# Patient Record
Sex: Female | Born: 1965 | Race: White | Hispanic: No | Marital: Married | Smoking: Never smoker
Health system: Southern US, Community
[De-identification: ages and names within clinical notes are randomized; demographics above are authoritative.]

## PROBLEM LIST (undated history)

## (undated) HISTORY — PX: COSMETIC SURGERY: SHX468

## (undated) HISTORY — PX: TUBAL LIGATION: SHX77

---

## 2013-10-27 ENCOUNTER — Encounter (HOSPITAL_BASED_OUTPATIENT_CLINIC_OR_DEPARTMENT_OTHER): Payer: Self-pay | Admitting: Emergency Medicine

## 2013-10-27 ENCOUNTER — Emergency Department (HOSPITAL_BASED_OUTPATIENT_CLINIC_OR_DEPARTMENT_OTHER)
Admission: EM | Admit: 2013-10-27 | Discharge: 2013-10-27 | Disposition: A | Discharge status: Home / Self Care | Payer: BC Managed Care – PPO | Attending: Emergency Medicine | Admitting: Emergency Medicine

## 2013-10-27 ENCOUNTER — Emergency Department (HOSPITAL_BASED_OUTPATIENT_CLINIC_OR_DEPARTMENT_OTHER): Payer: BC Managed Care – PPO

## 2013-10-27 DIAGNOSIS — S82109A Unspecified fracture of upper end of unspecified tibia, initial encounter for closed fracture: Secondary | ICD-10-CM | POA: Insufficient documentation

## 2013-10-27 DIAGNOSIS — S82111A Displaced fracture of right tibial spine, initial encounter for closed fracture: Secondary | ICD-10-CM

## 2013-10-27 DIAGNOSIS — W11XXXA Fall on and from ladder, initial encounter: Secondary | ICD-10-CM | POA: Insufficient documentation

## 2013-10-27 DIAGNOSIS — X500XXA Overexertion from strenuous movement or load, initial encounter: Secondary | ICD-10-CM | POA: Insufficient documentation

## 2013-10-27 DIAGNOSIS — Y929 Unspecified place or not applicable: Secondary | ICD-10-CM | POA: Insufficient documentation

## 2013-10-27 DIAGNOSIS — Y9339 Activity, other involving climbing, rappelling and jumping off: Secondary | ICD-10-CM | POA: Insufficient documentation

## 2013-10-27 MED ORDER — HYDROCODONE-ACETAMINOPHEN 5-325 MG PO TABS
1.0000 | ORAL_TABLET | Freq: Once | ORAL | Status: AC
  Administered 2013-10-27: 1 via ORAL
  Filled 2013-10-27: qty 1

## 2013-10-27 MED ORDER — HYDROCODONE-ACETAMINOPHEN 5-325 MG PO TABS: 2.0000 | ORAL_TABLET | Freq: Four times a day (QID) | ORAL | Status: DC | PRN

## 2013-10-27 NOTE — ED Notes (Signed)
Pt c/o right knee injury x 1 hr ago  

## 2013-10-27 NOTE — ED Provider Notes (Signed)
CSN: [id number]     Arrival date & time 10/27/13  1815 History   First MD Initiated Contact with Patient 10/27/13 1821     This chart was scribed for Gerhard Munch, MD by Arlan Organ, ED Scribe. This patient was seen in room MH08/MH08 and the patient's care was started 7:07 PM.   No chief complaint on file.  HPI HPI Comments: Sierra Silva is a 47 y.o. Female with a hx of knee pain who presents to the Emergency Department complaining of a knee pain that occurred 1 hour ago. She states she was climbing a ladder, fell down and twisted her knee. Pt denies any numbness in her toes. Pt states she is otherwise healthy. She denies currently being on any medications. Pt denies any hx of knee injury. She denies any allergies to medications.    No past medical history on file. No past surgical history on file. No family history on file. History  Substance Use Topics  . Smoking status: Not on file  . Smokeless tobacco: Not on file  . Alcohol Use: Not on file   OB History   No data available     Review of Systems  Constitutional:       Per HPI, otherwise negative  HENT:       Per HPI, otherwise negative  Respiratory:       Per HPI, otherwise negative  Cardiovascular:       Per HPI, otherwise negative  Gastrointestinal: Negative for vomiting.  Endocrine:       Negative aside from HPI  Genitourinary:       Neg aside from HPI   Musculoskeletal:       Per HPI, otherwise negative  Skin: Negative.   Neurological: Negative for syncope.    Allergies  Review of patient's allergies indicates not on file.  Home Medications  No current outpatient prescriptions on file. There were no vitals taken for this visit. Physical Exam  Nursing note and vitals reviewed. Constitutional: She is oriented to person, place, and time. She appears well-developed and well-nourished. No distress.  HENT:  Head: Normocephalic and atraumatic.  Eyes: Conjunctivae and EOM are normal.  Cardiovascular:  Normal rate and regular rhythm.   Pulmonary/Chest: Effort normal and breath sounds normal. No stridor. No respiratory distress.  Abdominal: She exhibits no distension.  Musculoskeletal: She exhibits tenderness. She exhibits no edema.       Right hip: Normal.       Right knee: She exhibits decreased range of motion, swelling and bony tenderness. She exhibits no effusion, no ecchymosis, no deformity, no laceration, no erythema, normal alignment, no LCL laxity, normal patellar mobility, normal meniscus and no MCL laxity. Tenderness found. Medial joint line and lateral joint line tenderness noted. No LCL and no patellar tendon tenderness noted.       Right ankle: Normal.  Neurological: She is alert and oriented to person, place, and time. No cranial nerve deficit.  Skin: Skin is warm and dry.  Psychiatric: She has a normal mood and affect.    ED Course  Procedures (including critical care time)  DIAGNOSTIC STUDIES: Oxygen Saturation is 99% on RA, Normal by my interpretation.    COORDINATION OF CARE: 7:11 PM- Will order X-Rays. Will give pain medication. Discussed treatment plan with pt at bedside and pt agreed to plan.      Update: X-rays suggest fracture, and with concern for tibial plateau involvement, CT scan was ordered. Labs Review Labs Reviewed - No data  to display Imaging Review No results found.  EKG Interpretation   None      Update: I discussed the patient's CT findings with orthopedist.  Patient will be discharged to follow up in the office after provision of a mobilization device. MDM  No diagnosis found.  I personally performed the services described in this documentation, which was scribed in my presence. The recorded information has been reviewed and is accurate.   This generally well female presents after a fall with ongoing pain in her knees.  On exam the knee is grossly stable, though there is tenderness to palpation about the lateral aspect.  The ankle and hip  are unremarkable.  She is distally neurovascularly intact.  After CT scan demonstrated a likely tibial spine fracture, the patient had a mobilization device applied, crutches provided, and was discharged in stable condition to follow up with orthopedics per  Gerhard Munch, MD 10/27/13 2024

## 2013-12-08 IMAGING — CT CT KNEE*R* W/O CM
3 series · 12 of 33 positions shown, 14 images · non-contrast
Comparison: Radiographs 10/27/2013.

CLINICAL DATA: Tibial plateau fracture.

EXAM:
CT OF THE RIGHT KNEE WITHOUT CONTRAST
TECHNIQUE: Multidetector CT imaging was performed according to the standard
protocol. Multiplanar CT image reconstructions were also generated.

[Series 4: knee 2.0 b31s · axial · 0.32mm/px · z∈[-206,-58]mm · 4 of 108 slices shown, 5 images]
[im 17/108  soft-tissue]
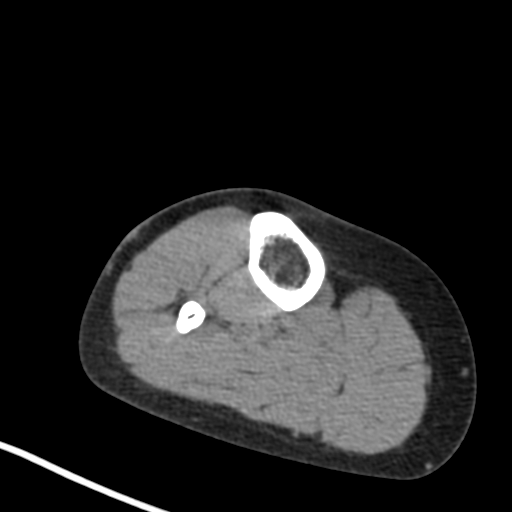
[im 17/108  bone]
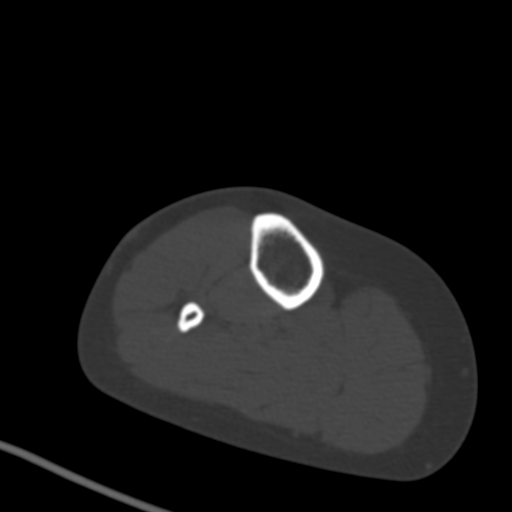
[im 42/108  bone]
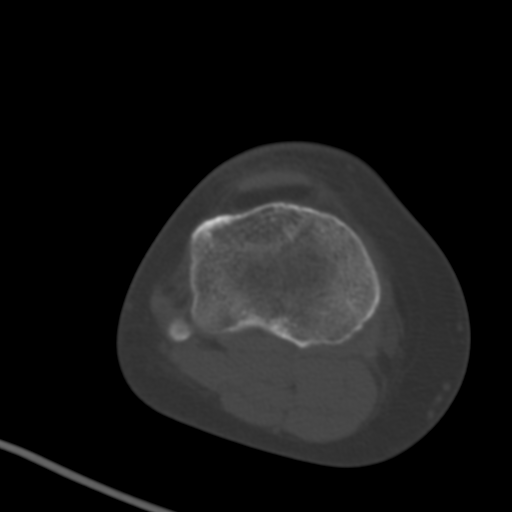
[im 66/108  bone]
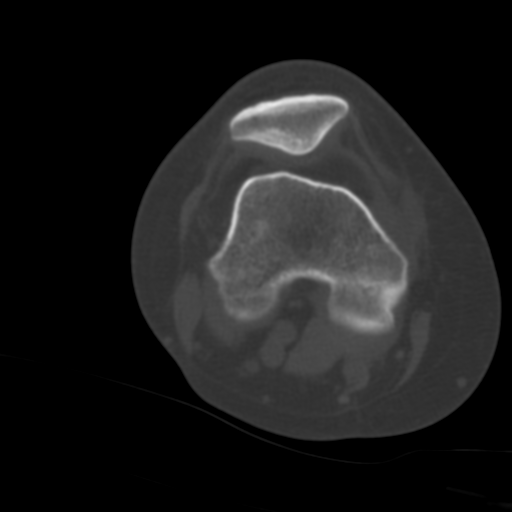
[im 91/108  bone]
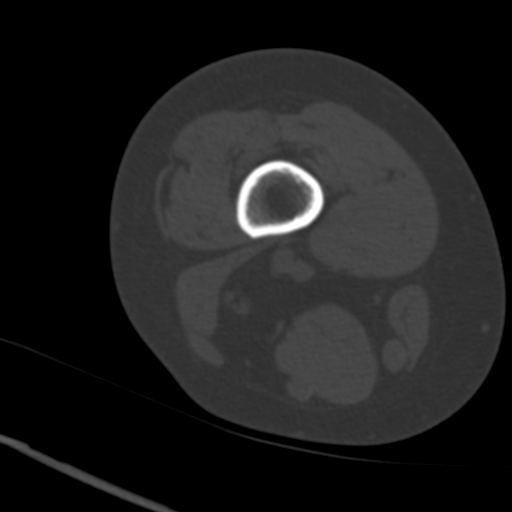

[Series 8: knee 2.0 coronal · coronal · 0.29mm/px · 3 of 66 slices shown]
[im 14/66  bone]
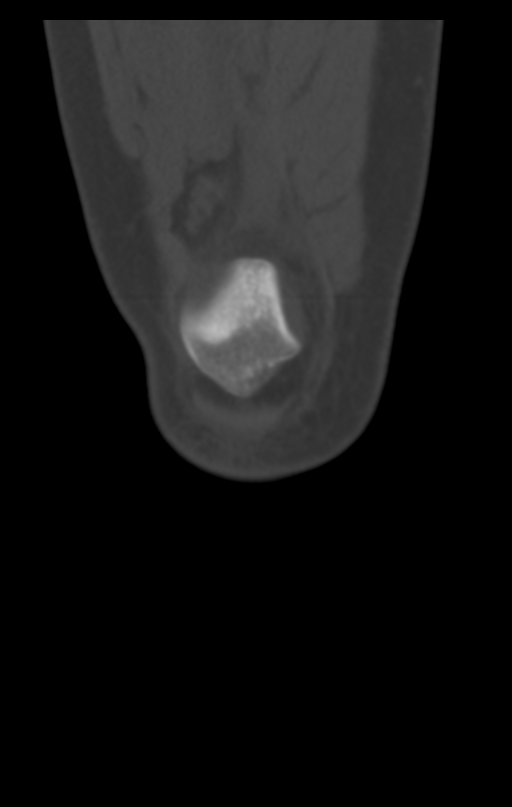
[im 27/66  bone]
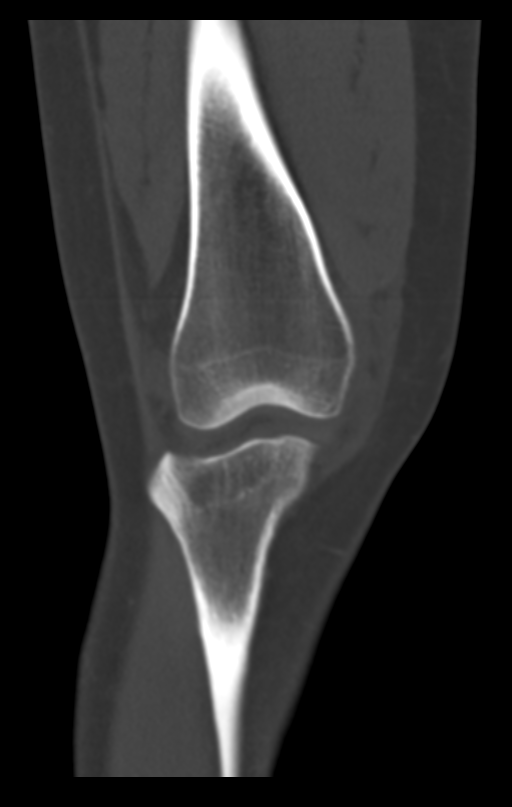
[im 40/66  bone]
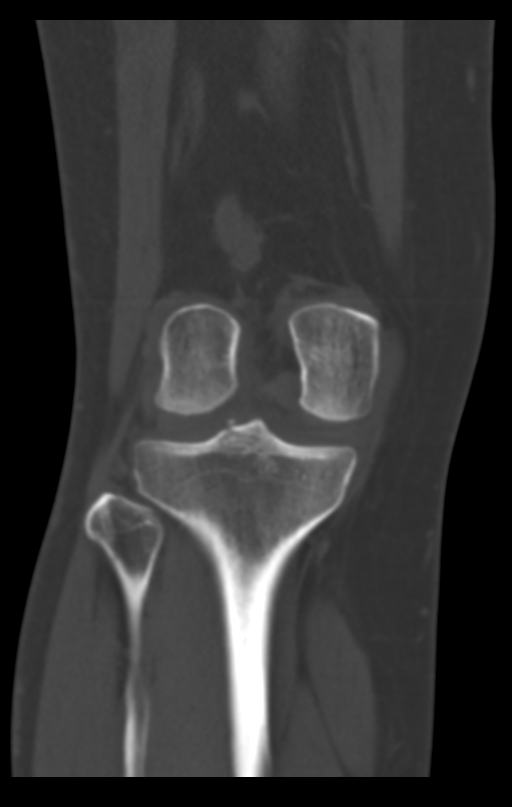

[Series 9: knee 2.0 sagittal · sagittal · 0.29mm/px · 5 of 64 slices shown, 6 images]
[im 22/64  bone]
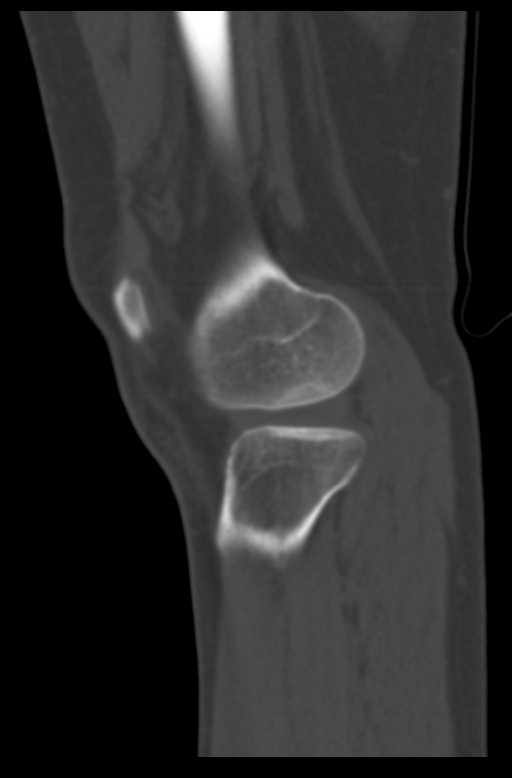
[im 27/64  bone]
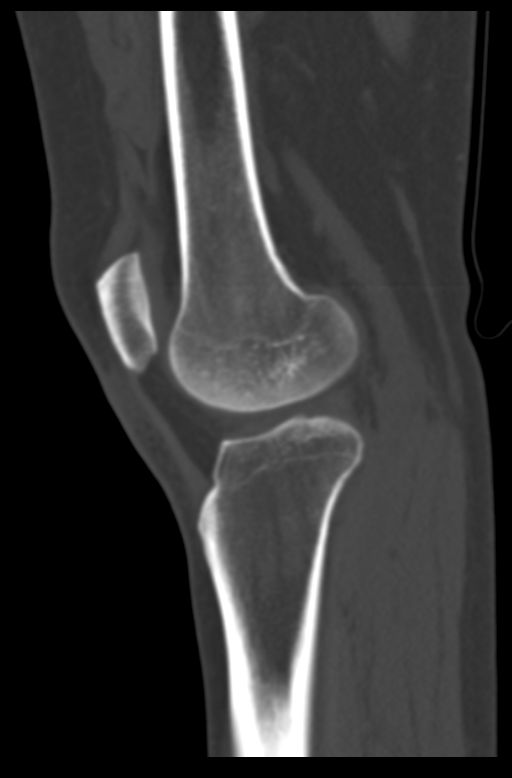
[im 32/64  soft-tissue]
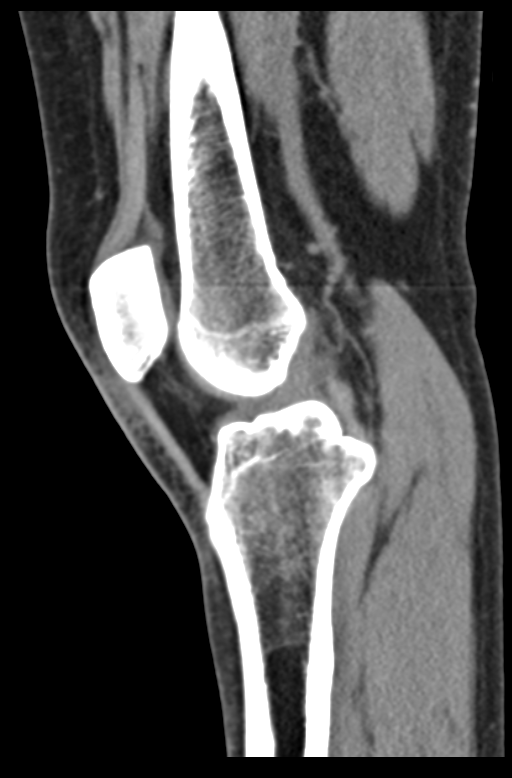
[im 32/64  bone]
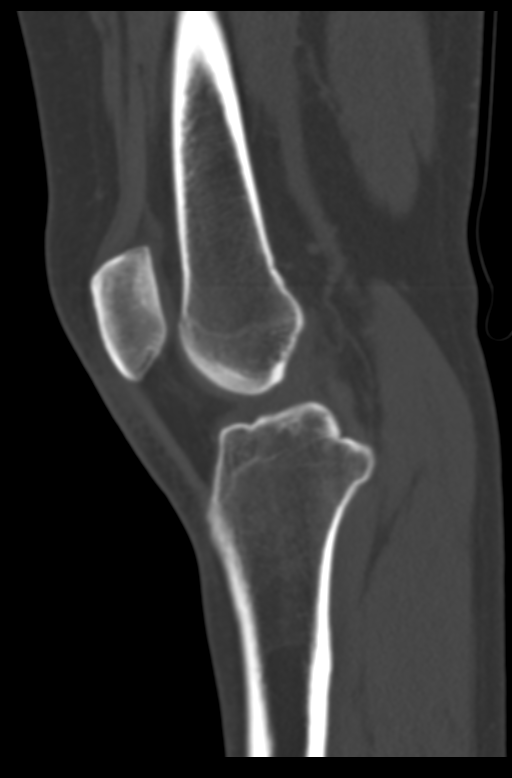
[im 37/64  bone]
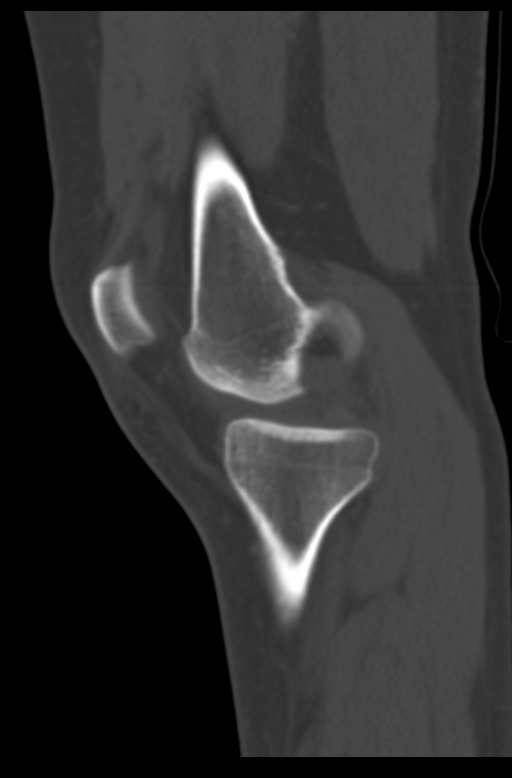
[im 43/64  bone]
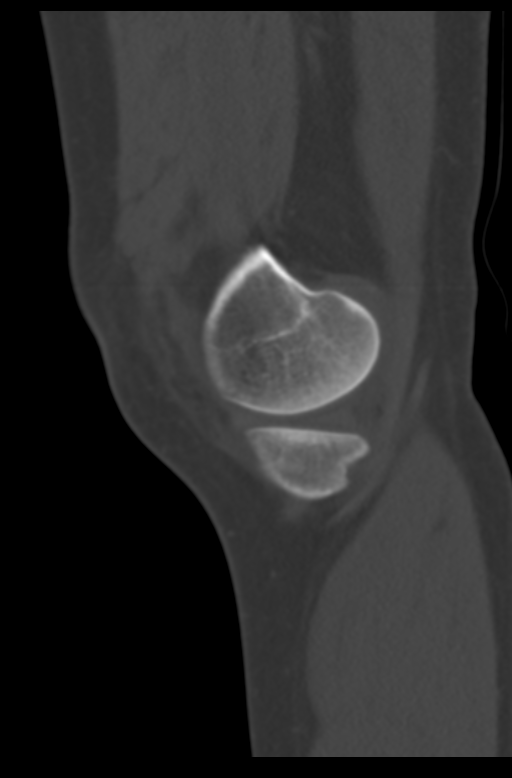

[12 of 33 positions shown; findings below may reference images not displayed]

FINDINGS: Mild cortical irregularity involving the lateral tibial spine with
possible small cortical fracture an avulsion fracture. No tibial
plateau fracture is identified. The medial and lateral compartments
are maintained. No femur fracture. Possible impaction type injury
involving the lateral femoral condyle versus and old healed
osteochondral lesion.

There is a very small joint effusion. Grossly the PCL is intact. I
do not see the ACL very well. The patella and fibula are normal.
IMPRESSION: Acute versus chronic avulsion fracture involving the lateral tibial
spine.

No tibial plateau fracture.

Small joint effusion.

MRI may be helpful for further evaluation if pain persists and to
evaluate for internal derangement.

## 2014-08-18 ENCOUNTER — Encounter: Payer: Self-pay | Admitting: *Deleted

## 2014-08-19 ENCOUNTER — Ambulatory Visit (INDEPENDENT_AMBULATORY_CARE_PROVIDER_SITE_OTHER): Payer: BC Managed Care – PPO | Admitting: *Deleted

## 2014-08-19 DIAGNOSIS — I781 Nevus, non-neoplastic: Secondary | ICD-10-CM

## 2014-08-19 NOTE — Progress Notes (Signed)
X=.3% Sotradecol administered with a 27g butterfly.  Patient received a total of 4cc.  Treated all areas of concern. Tiny vessels but able to inject them. Tol well. Anticipate good results. May need CL clean up in future.) Follow prn.   Photos: yes  Compression stockings applied: yes

## 2014-08-21 ENCOUNTER — Other Ambulatory Visit: Payer: Self-pay | Admitting: Vascular Surgery

## 2014-08-25 ENCOUNTER — Encounter: Payer: Self-pay | Admitting: Vascular Surgery

## 2015-02-03 ENCOUNTER — Ambulatory Visit (INDEPENDENT_AMBULATORY_CARE_PROVIDER_SITE_OTHER): Payer: BLUE CROSS/BLUE SHIELD | Admitting: Family Medicine

## 2015-02-03 ENCOUNTER — Ambulatory Visit (INDEPENDENT_AMBULATORY_CARE_PROVIDER_SITE_OTHER): Payer: BLUE CROSS/BLUE SHIELD

## 2015-02-03 VITALS — BP 138/86 | HR 76 | Temp 97.7°F | Resp 16 | Ht 59.0 in | Wt 133.8 lb

## 2015-02-03 DIAGNOSIS — M5442 Lumbago with sciatica, left side: Secondary | ICD-10-CM

## 2015-02-03 DIAGNOSIS — Z7189 Other specified counseling: Secondary | ICD-10-CM

## 2015-02-03 DIAGNOSIS — Z23 Encounter for immunization: Secondary | ICD-10-CM

## 2015-02-03 DIAGNOSIS — M5441 Lumbago with sciatica, right side: Secondary | ICD-10-CM

## 2015-02-03 DIAGNOSIS — Z7184 Encounter for health counseling related to travel: Secondary | ICD-10-CM

## 2015-02-03 DIAGNOSIS — Z7185 Encounter for immunization safety counseling: Secondary | ICD-10-CM

## 2015-02-03 MED ORDER — MELOXICAM 7.5 MG PO TABS: 7.5000 mg | ORAL_TABLET | Freq: Every day | ORAL | Status: DC

## 2015-02-03 MED ORDER — TYPHOID VACCINE PO CPDR: 1.0000 | DELAYED_RELEASE_CAPSULE | ORAL | Status: DC

## 2015-02-03 NOTE — Patient Instructions (Addendum)
You were given the first hepatitis A vaccine - return as discussed for 2nd one in 6 months. This will start protection, but not be completed prior to your travel this Sunday. Your tetanus was also updated for 10 years. I prescribed the typhoid vaccine - start today - take one every other day for 4 doses. Usually we would want this completed a week before travel, but can start today as discussed.   For your back pain - Try the mobic each morning (do not combine with other over the counter pain relievers), Heat or ice to area as needed and the other treatments and exercises in the back care manual as tolerated, and range of motion and stretches as tolerated in this book. If pain not improved with stretches and meloxicam - call me and I will refer you to back specialist - orthopaedist to determine next step in treatment and to discuss if injection may be helpful. Return to the clinic or go to the nearest emergency room if any of your symptoms worsen or new symptoms occur.

## 2015-02-03 NOTE — Progress Notes (Signed)
Subjective:    Patient ID: Sierra Silva, female    DOB: 07-28-1966, 49 y.o.   MRN: [id number]  HPI Chief Complaint  Patient presents with  . Leg Pain    left x months  . Back Pain    left   This chart was scribed for Meredith Staggers, MD by Andrew Au, ED Scribe. This patient was seen in room 3 and the patient's care was started at 7:10 PM.  HPI Comments: Sierra Silva is a 49 y.o. female who presents to the [facility] complaining of worsening left back pain onset several months. Pt has pain, tinging and weakness that starts at left lower back and shoots down left leg. She is able to bear weight and walk but has pain when doing so. Pt states she has been to [facility] multiple times and has been prescribed prednisone pills twice and hydrocodone without relief. Pt states she does not like taking hydrocodone due to medication making her sleepy. Pt has been taking advil.  Pt states stands at work all day. Pt has not been to physical therapist. Pt denies bladder and bowel incontinence.   Pt is traveling out of the country to [place] in 4 days for 3 weeks. Pt is unsure of last tetanus but believes she last received one over 10 years ago. Pt does not have a PCP. Pt denies hx of hepatitis and trouble with her Liver. She has received Flu shot this year. Pt denies hiking, entering caves, and working with animals while on her trip.   There are no active problems to display for this patient.  History reviewed. No pertinent past medical history. Past Surgical History  Procedure Laterality Date  . Tubal ligation     No Known Allergies Prior to Admission medications   Medication Sig Start Date End Date Taking? Authorizing Provider  cyclobenzaprine (FLEXERIL) 10 MG tablet Take 10 mg by mouth 3 (three) times daily as needed for muscle spasms.   Yes Historical Provider, MD  predniSONE (DELTASONE) 10 MG tablet Take 10 mg by mouth daily with breakfast.   Yes Historical Provider,  MD   History   Social History  . Marital Status: Married    Spouse Name: N/A  . Number of Children: N/A  . Years of Education: N/A   Occupational History  . Not on file.   Social History Main Topics  . Smoking status: Never Smoker   . Smokeless tobacco: Not on file  . Alcohol Use: No  . Drug Use: No  . Sexual Activity: Yes    Birth Control/ Protection: None   Other Topics Concern  . Not on file   Social History Narrative   Review of Systems  Genitourinary: Negative for enuresis and difficulty urinating.  Musculoskeletal: Positive for myalgias and back pain. Negative for gait problem.    Objective:   Physical Exam  Constitutional: She is oriented to person, place, and time. She appears well-developed and well-nourished. No distress.  HENT:  Head: Normocephalic and atraumatic.  Eyes: Conjunctivae and EOM are normal.  Neck: Neck supple.  Cardiovascular: Normal rate.   Pulmonary/Chest: Effort normal.  Musculoskeletal: Normal range of motion.  Tenderness to left paraspinal muscle to sciatic notch on back. Full ROM of back full flexion able to touch toes. Full rotation. Heel toe normal. Negative straight leg.  Neurological: She is alert and oriented to person, place, and time.  Reflex Scores:      Patellar reflexes are 2+ on the  right side and 2+ on the left side.      Achilles reflexes are 2+ on the right side and 2+ on the left side. Skin: Skin is warm and dry.  Psychiatric: She has a normal mood and affect. Her behavior is normal.  Nursing note and vitals reviewed.  Filed Vitals:   02/03/15 1826  BP: 138/86  Pulse: 76  Temp: 97.7 F (36.5 C)  TempSrc: Oral  Resp: 16  Height:  (1.499 m)  Weight: 133 lb 12.8 oz (60.691 kg)  SpO2: 100%   [facility] reading (PRIMARY) by Dr. Neva Seat. LS spine- decreased lordosis but otherwise no acute findings.     Assessment & Plan:   Sierra Silva is a 49 y.o. female Left-sided low back pain with left-sided sciatica -  Plan: DG Lumbar Spine 2-3 Views, meloxicam (MOBIC) 7.5 MG tablet  -overall reassuring exam. Some reproduction with piriformis stretch, but discogenic or radicular component with prolonged standing likely. Not weak and reflexes ok. No significant relief with prior prednisone dosing.   -start with HEP by back care manual, trial of mobic QD prn to complement HEP - SED, and avoid long term use with cardiovascular risks, but formal PT or further imaging may be needed. Consider ortho eval if not improving next few weeks as going out of country. Can call if she would like to see ortho and will place referral. rtc or provider where she is staying if worse.   Counseling about travel ,Immunization counseling - Plan: Hepatitis A vaccine adult IM, Tdap vaccine greater than or equal to 7yo IM, typhoid (VIVOTIF BERNA VACCINE) DR capsule, CANCELED: Td vaccine greater than or equal to 7yo preservative free IM  -Need for hepatitis A immunization - Plan: Hepatitis A vaccine adult IM 1st dose given, discussed incomplete immunity with one dose. Repeat in 6 months.   -Immunization, tetanus-diphtheria - Plan: Tdap vaccine greater than or equal to 7yo IM given  -Need for immunization against typhoid - Plan: typhoid (VIVOTIF BERNA VACCINE) DR capsule prescribed QOD for 4 doses. Denies personal or family members with immune deficiency. Discussed usually would recommend completion 1 week prior to travel.   -CDC.gov for other travel recommendations.    Meds ordered this encounter  Medications  . predniSONE (DELTASONE) 10 MG tablet    Sig: Take 10 mg by mouth daily with breakfast.  . cyclobenzaprine (FLEXERIL) 10 MG tablet    Sig: Take 10 mg by mouth 3 (three) times daily as needed for muscle spasms.  . typhoid (VIVOTIF BERNA VACCINE) DR capsule    Sig: Take 1 capsule by mouth every other day.    Dispense:  4 capsule    Refill:  0  . meloxicam (MOBIC) 7.5 MG tablet    Sig: Take 1 tablet (7.5 mg total) by mouth daily.     Dispense:  30 tablet    Refill:  0   Patient Instructions  You were given the first hepatitis A vaccine - return as discussed for 2nd one in 6 months. This will start protection, but not be completed prior to your travel this Sunday. Your tetanus was also updated for 10 years. I prescribed the typhoid vaccine - start today - take one every other day for 4 doses. Usually we would want this completed a week before travel, but can start today as discussed.   For your back pain - Try the mobic each morning (do not combine with other over the counter pain relievers), Heat or ice to  area as needed and the other treatments and exercises in the back care manual as tolerated, and range of motion and stretches as tolerated in this book. If pain not improved with stretches and meloxicam - call me and I will refer you to back specialist - orthopaedist to determine next step in treatment and to discuss if injection may be helpful. Return to the clinic or go to the nearest emergency room if any of your symptoms worsen or new symptoms occur.       I personally performed the services described in this documentation, which was scribed in my presence. The recorded information has been reviewed and considered, and addended by me as needed.

## 2015-03-27 ENCOUNTER — Ambulatory Visit (INDEPENDENT_AMBULATORY_CARE_PROVIDER_SITE_OTHER): Payer: BLUE CROSS/BLUE SHIELD | Admitting: Family Medicine

## 2015-03-27 ENCOUNTER — Other Ambulatory Visit: Payer: Self-pay | Admitting: Family Medicine

## 2015-03-27 ENCOUNTER — Ambulatory Visit: Payer: BLUE CROSS/BLUE SHIELD | Admitting: Family Medicine

## 2015-03-27 VITALS — BP 110/80 | HR 68 | Temp 97.5°F | Resp 17 | Ht 59.02 in | Wt 132.0 lb

## 2015-03-27 VITALS — BP 110/80 | HR 68 | Temp 97.5°F | Resp 17 | Ht 59.0 in | Wt 132.0 lb

## 2015-03-27 DIAGNOSIS — Z0000 Encounter for general adult medical examination without abnormal findings: Secondary | ICD-10-CM

## 2015-03-27 DIAGNOSIS — Z1239 Encounter for other screening for malignant neoplasm of breast: Secondary | ICD-10-CM | POA: Diagnosis not present

## 2015-03-27 DIAGNOSIS — Z130 Encounter for screening for diseases of the blood and blood-forming organs and certain disorders involving the immune mechanism: Secondary | ICD-10-CM | POA: Diagnosis not present

## 2015-03-27 DIAGNOSIS — M545 Low back pain, unspecified: Secondary | ICD-10-CM

## 2015-03-27 DIAGNOSIS — M5442 Lumbago with sciatica, left side: Secondary | ICD-10-CM

## 2015-03-27 DIAGNOSIS — Z124 Encounter for screening for malignant neoplasm of cervix: Secondary | ICD-10-CM

## 2015-03-27 DIAGNOSIS — Z13220 Encounter for screening for lipoid disorders: Secondary | ICD-10-CM | POA: Diagnosis not present

## 2015-03-27 DIAGNOSIS — M79605 Pain in left leg: Secondary | ICD-10-CM

## 2015-03-27 DIAGNOSIS — Z131 Encounter for screening for diabetes mellitus: Secondary | ICD-10-CM

## 2015-03-27 MED ORDER — MELOXICAM 7.5 MG PO TABS: 7.5000 mg | ORAL_TABLET | Freq: Every day | ORAL | Status: DC

## 2015-03-27 NOTE — Progress Notes (Signed)
[facility] [address], [place] [address] [phone]  Date:  03/27/2015   Name:  Sierra Silva   DOB:  07/12/1966   MRN:  [id number]  PCP:  No PCP Per Patient    Chief Complaint: No chief complaint on file.   History of Present Illness:  Sierra Silva is a 49 y.o. very pleasant female patient who presents with the following:  She is here today seeking a CPE Also, she has noted back pain for the last couple of years since she fell in 2014.  When she fell she hurt her right knee and strained her left back- however she did not pay too much attention to her back at the time but then it started to hurt She would like to see the PT who helped her with her knee about her back- needs a referral for this  She notes pain down the left leg for about 4 months. This is there most days- especially after she works (she stands all day) She notes just pain- no numbness or weakness.  No bowel or bladder incont.   She has a physical therapist- they helped her with her knee in the past.,  She would like to see this person but needs a referral   She would like to have some blood tests.  She is fasting today for labs.    She was treated [place] prednisone from [place]- she is done with this now, does not know if it really helped her.  However the mobic did help and she would like to RF this.    There are no active problems to display for this patient.   No past medical history on file.  Past Surgical History  Procedure Laterality Date  . Tubal ligation      History  Substance Use Topics  . Smoking status: Never Smoker   . Smokeless tobacco: Not on file  . Alcohol Use: No    No family history on file.  No Known Allergies  Medication list has been reviewed and updated.  Current Outpatient Prescriptions on File Prior to Visit  Medication Sig Dispense Refill  . cyclobenzaprine (FLEXERIL) 10 MG tablet Take 10 mg by mouth 3 (three) times daily  as needed for muscle spasms.    . meloxicam (MOBIC) 7.5 MG tablet Take 1 tablet (7.5 mg total) by mouth daily. 30 tablet 0  . predniSONE (DELTASONE) 10 MG tablet Take 10 mg by mouth daily with breakfast.    . typhoid (VIVOTIF BERNA VACCINE) DR capsule Take 1 capsule by mouth every other day. 4 capsule 0   No current facility-administered medications on file prior to visit.    Review of Systems:  As per HPI- otherwise negative.   Physical Examination: Filed Vitals:   03/27/15 1341  BP: 110/80  Pulse: 68  Temp: 97.5 F (36.4 C)  Resp: 17   Filed Vitals:   03/27/15 1341  Height: 4' 11.02" (1.499 m)  Weight: 132 lb (59.875 kg)   Body mass index is 26.65 kg/(m^2). Ideal Body Weight: Weight in (lb) to have BMI = 25: 123.6  GEN: WDWN, NAD, Non-toxic, A & O x 3 HEENT: Atraumatic, Normocephalic. Neck supple. No masses, No LAD.  Bilateral TM wnl, oropharynx normal.  PEERL,EOMI.   Ears and Nose: No external deformity. CV: RRR, No M/G/R. No JVD. No thrill. No extra heart sounds. PULM: CTA B, no wheezes, crackles, rhonchi. No retractions. No resp. distress.  No accessory muscle use. ABD: S, NT, ND, +BS. No rebound. No HSM. EXTR: No c/c/e NEURO Normal gait.  PSYCH: Normally interactive. Conversant. Not depressed or anxious appearing.  Calm demeanor.  Left lower back is in spasm and is TTP normlal strength, sensation and DTR of both LE, negative SLR bilaterally  Pelvic: normal, no vaginal lesions or discharge. Uterus normal, no CMT, no adnexal tendereness or masses  Assessment and Plan: Physical exam  Screening for cervical cancer - Plan: Pap IG and HPV (high risk) DNA detection  Screening for deficiency anemia - Plan: CBC  Screening for diabetes mellitus - Plan: Comprehensive metabolic panel  Screening for hyperlipidemia - Plan: Lipid panel  Low back pain radiating to left lower extremity  Left-sided low back pain with left-sided sciatica - Plan: Ambulatory referral to  Physical Therapy, meloxicam (MOBIC) 7.5 MG tablet  Screening for breast cancer - Plan: MM Digital Screening  Screening labs and PE as above,  Referral for a mammogram and to see her PT who has helped her in the past  refilled her mobic that has been helpful for her back pain  Signed Abbe Amsterdam, MD  [facility] [address] in [place]

## 2015-03-27 NOTE — Progress Notes (Deleted)
[facility] [address], [place] [address] [phone]  Date:  03/27/2015   Name:  Sierra Silva   DOB:  06/07/1966   MRN:  [id number]  PCP:  No primary care provider on file.    Chief Complaint: Annual Exam and Back Pain   History of Present Illness:  Sierra Silva is a 49 y.o. very pleasant female patient who presents with the following:  Here today as a new patinet seeking a CPE>    There are no active problems to display for this patient.   History reviewed. No pertinent past medical history.  History reviewed. No pertinent past surgical history.  History  Substance Use Topics  . Smoking status: Never Smoker   . Smokeless tobacco: Not on file  . Alcohol Use: Not on file    History reviewed. No pertinent family history.  No Known Allergies  Medication list has been reviewed and updated.  No current outpatient prescriptions on file prior to visit.   No current facility-administered medications on file prior to visit.    Review of Systems:  ***  Physical Examination: Filed Vitals:   03/27/15 1256  BP: 110/80  Pulse: 68  Temp: 97.5 F (36.4 C)  Resp: 17   Filed Vitals:   03/27/15 1256  Height: 4\' 11"  (1.499 m)  Weight: 132 lb (59.875 kg)   Body mass index is 26.65 kg/(m^2). Ideal Body Weight: Weight in (lb) to have BMI = 25: 123.5  ***  Assessment and Plan: ***  Signed Abbe AmsterdamJessica Gerrald Basu, MD

## 2015-03-27 NOTE — Patient Instructions (Addendum)
I will be in touch with your labs asap Use the mobic as needed for your back pain I will get you set up for a mammogram and to see your physical therapist Let us know if you need anything else!

## 2015-03-28 LAB — LIPID PANEL
CHOL/HDL RATIO: 4.4 ratio
CHOL/HDL RATIO: 4.4 ratio
Cholesterol: 250 mg/dL — ABNORMAL HIGH (ref 0–200)
Cholesterol: 250 mg/dL — ABNORMAL HIGH (ref 0–200)
HDL: 57 mg/dL (ref >=46)
HDL: 57 mg/dL (ref >=46)
LDL Cholesterol: 174 mg/dL — ABNORMAL HIGH (ref 0–99)
LDL Cholesterol: 174 mg/dL — ABNORMAL HIGH (ref 0–99)
Triglycerides: 95 mg/dL (ref <150)
Triglycerides: 95 mg/dL (ref <150)
VLDL: 19 mg/dL (ref 0–40)
VLDL: 19 mg/dL (ref 0–40)

## 2015-03-28 LAB — CBC

## 2015-03-28 LAB — COMPREHENSIVE METABOLIC PANEL
ALBUMIN: 4.3 g/dL (ref 3.5–5.2)
ALK PHOS: 50 U/L (ref 39–117)
ALT: 24 U/L (ref 0–35)
ALT: 24 U/L (ref 0–35)
AST: 23 U/L (ref 0–37)
AST: 23 U/L (ref 0–37)
Albumin: 4.3 g/dL (ref 3.5–5.2)
Alkaline Phosphatase: 50 U/L (ref 39–117)
BUN: 14 mg/dL (ref 6–23)
BUN: 14 mg/dL (ref 6–23)
CALCIUM: 9.5 mg/dL (ref 8.4–10.5)
CHLORIDE: 104 meq/L (ref 96–112)
CHLORIDE: 104 meq/L (ref 96–112)
CO2: 25 meq/L (ref 19–32)
CO2: 25 mEq/L (ref 19–32)
Calcium: 9.5 mg/dL (ref 8.4–10.5)
Creat: 0.53 mg/dL (ref 0.50–1.10)
Creat: 0.53 mg/dL (ref 0.50–1.10)
GLUCOSE: 77 mg/dL (ref 70–99)
Glucose, Bld: 77 mg/dL (ref 70–99)
POTASSIUM: 4 meq/L (ref 3.5–5.3)
Potassium: 4 mEq/L (ref 3.5–5.3)
Sodium: 139 mEq/L (ref 135–145)
Sodium: 139 mEq/L (ref 135–145)
TOTAL PROTEIN: 7.5 g/dL (ref 6.0–8.3)
TOTAL PROTEIN: 7.5 g/dL (ref 6.0–8.3)
Total Bilirubin: 0.8 mg/dL (ref 0.2–1.2)
Total Bilirubin: 0.8 mg/dL (ref 0.2–1.2)

## 2015-03-28 NOTE — Progress Notes (Signed)
This note created in error- need to consolidate pt MRN

## 2015-03-29 ENCOUNTER — Telehealth: Payer: Self-pay

## 2015-03-29 LAB — PAP IG AND HPV HIGH-RISK

## 2015-03-29 LAB — CBC

## 2015-03-29 NOTE — Telephone Encounter (Signed)
Error

## 2015-03-30 LAB — PAP IG AND HPV HIGH-RISK: HPV DNA High Risk: NOT DETECTED

## 2015-03-31 ENCOUNTER — Encounter: Payer: Self-pay | Admitting: Family Medicine

## 2015-08-08 ENCOUNTER — Ambulatory Visit (INDEPENDENT_AMBULATORY_CARE_PROVIDER_SITE_OTHER): Payer: BLUE CROSS/BLUE SHIELD | Admitting: *Deleted

## 2015-08-08 DIAGNOSIS — Z23 Encounter for immunization: Secondary | ICD-10-CM

## 2015-08-08 NOTE — Progress Notes (Signed)
Patient here today for 2nd hep A vaccine. Given in left deltoid patient tolerated well.

## 2015-08-08 NOTE — Progress Notes (Signed)
   Subjective:    Patient ID: Sierra Silva, female    DOB: [birth year], 49 y.o.   MRN: [id number]  HPI    Review of Systems     Objective:   Physical Exam        Assessment & Plan:

## 2015-08-08 NOTE — Addendum Note (Signed)
Addended by: Mila Merry on: 08/08/2015 02:42 PM   Modules accepted: Level of Service

## 2016-03-21 ENCOUNTER — Encounter: Payer: Self-pay | Admitting: *Deleted

## 2016-03-29 ENCOUNTER — Ambulatory Visit (INDEPENDENT_AMBULATORY_CARE_PROVIDER_SITE_OTHER): Payer: BLUE CROSS/BLUE SHIELD | Admitting: *Deleted

## 2016-03-29 DIAGNOSIS — I781 Nevus, non-neoplastic: Secondary | ICD-10-CM

## 2016-03-29 NOTE — Progress Notes (Signed)
   Cutaneous Laser:pulsed mode  810j/cm2 400 ms delay  13 ms Duration 0.5 spot  Total pulses: 583 Total energy 926  Total time:  :07  Photos: No.  Compression stockings applied: No.NA  Pt wanted "clean up with the laser ". Easy access. Nice rxn to the laser. Tol well. Areas look as expected. Will follow prn.

## 2016-05-20 ENCOUNTER — Ambulatory Visit (INDEPENDENT_AMBULATORY_CARE_PROVIDER_SITE_OTHER): Payer: BLUE CROSS/BLUE SHIELD | Admitting: Family Medicine

## 2016-05-20 VITALS — BP 130/82 | HR 77 | Temp 97.4°F | Resp 18 | Ht 59.5 in | Wt 136.0 lb

## 2016-05-20 DIAGNOSIS — Z124 Encounter for screening for malignant neoplasm of cervix: Secondary | ICD-10-CM

## 2016-05-20 DIAGNOSIS — Z114 Encounter for screening for human immunodeficiency virus [HIV]: Secondary | ICD-10-CM | POA: Diagnosis not present

## 2016-05-20 DIAGNOSIS — E785 Hyperlipidemia, unspecified: Secondary | ICD-10-CM | POA: Diagnosis not present

## 2016-05-20 DIAGNOSIS — N852 Hypertrophy of uterus: Secondary | ICD-10-CM | POA: Diagnosis not present

## 2016-05-20 DIAGNOSIS — Z01419 Encounter for gynecological examination (general) (routine) without abnormal findings: Secondary | ICD-10-CM

## 2016-05-20 DIAGNOSIS — Z131 Encounter for screening for diabetes mellitus: Secondary | ICD-10-CM | POA: Diagnosis not present

## 2016-05-20 DIAGNOSIS — N951 Menopausal and female climacteric states: Secondary | ICD-10-CM

## 2016-05-20 DIAGNOSIS — Z1329 Encounter for screening for other suspected endocrine disorder: Secondary | ICD-10-CM

## 2016-05-20 DIAGNOSIS — Z0000 Encounter for general adult medical examination without abnormal findings: Secondary | ICD-10-CM | POA: Diagnosis not present

## 2016-05-20 LAB — POCT URINALYSIS DIP (MANUAL ENTRY)
Bilirubin, UA: NEGATIVE
Glucose, UA: NEGATIVE
Ketones, POC UA: NEGATIVE
LEUKOCYTES UA: NEGATIVE
NITRITE UA: NEGATIVE
PROTEIN UA: NEGATIVE
Spec Grav, UA: 1.01
UROBILINOGEN UA: 0.2
pH, UA: 5.5

## 2016-05-20 LAB — COMPREHENSIVE METABOLIC PANEL
ALBUMIN: 4.7 g/dL (ref 3.6–5.1)
ALK PHOS: 64 U/L (ref 33–115)
ALT: 16 U/L (ref 6–29)
AST: 20 U/L (ref 10–35)
BILIRUBIN TOTAL: 0.5 mg/dL (ref 0.2–1.2)
BUN: 15 mg/dL (ref 7–25)
CHLORIDE: 103 mmol/L (ref 98–110)
CO2: 26 mmol/L (ref 20–31)
Calcium: 9.8 mg/dL (ref 8.6–10.2)
Creat: 0.66 mg/dL (ref 0.50–1.10)
Glucose, Bld: 82 mg/dL (ref 65–99)
Potassium: 4.1 mmol/L (ref 3.5–5.3)
Sodium: 140 mmol/L (ref 135–146)
Total Protein: 7.8 g/dL (ref 6.1–8.1)

## 2016-05-20 LAB — CBC WITH DIFFERENTIAL/PLATELET
BASOS ABS: 0 {cells}/uL (ref 0–200)
Basophils Relative: 0 %
Eosinophils Absolute: 80 cells/uL (ref 15–500)
Eosinophils Relative: 1 %
HCT: 42.4 % (ref 35.0–45.0)
HEMOGLOBIN: 14.1 g/dL (ref 11.7–15.5)
LYMPHS ABS: 2800 {cells}/uL (ref 850–3900)
Lymphocytes Relative: 35 %
MCH: 29 pg (ref 27.0–33.0)
MCHC: 33.3 g/dL (ref 32.0–36.0)
MCV: 87.2 fL (ref 80.0–100.0)
MONO ABS: 480 {cells}/uL (ref 200–950)
MPV: 10.2 fL (ref 7.5–12.5)
Monocytes Relative: 6 %
NEUTROS PCT: 58 %
Neutro Abs: 4640 cells/uL (ref 1500–7800)
Platelets: 253 10*3/uL (ref 140–400)
RBC: 4.86 MIL/uL (ref 3.80–5.10)
RDW: 13.3 % (ref 11.0–15.0)
WBC: 8 10*3/uL (ref 3.8–10.8)

## 2016-05-20 LAB — POC MICROSCOPIC URINALYSIS (UMFC)

## 2016-05-20 LAB — LIPID PANEL
Cholesterol: 252 mg/dL — ABNORMAL HIGH (ref 125–200)
HDL: 67 mg/dL (ref >=46)
LDL CALC: 166 mg/dL — AB (ref <130)
Total CHOL/HDL Ratio: 3.8 Ratio (ref <=5.0)
Triglycerides: 94 mg/dL (ref <150)
VLDL: 19 mg/dL (ref <30)

## 2016-05-20 LAB — POCT URINE PREGNANCY: PREG TEST UR: NEGATIVE

## 2016-05-20 LAB — TSH: TSH: 1.27 m[IU]/L

## 2016-05-20 NOTE — Progress Notes (Signed)
Subjective:    Patient ID: Sierra Silva, female    DOB: 01-Sep-1966, 50 y.o.   MRN: [id number]  05/20/2016  Annual Exam   HPI This 50 y.o. female presents for Complete Physical Examination.  Last physical:  03-27-2015 Pap smear:  03-27-2015 WNL HPV negative; [place]; LMP not sure; LMP 09/2015 Mammogram:  [facility]; can visualize note from Provider on 05-06-2015 regarding normal mammogram yet cannot see report.   TDAP:  01-2015 Hepatitis A 07-2015, 01-2015 Influenza:  09/2015 Eye exam: 03/2016; no glaucoma or cataracts ;followed every six months due to growth R eye. Dental exam:  Every six months.    Review of Systems  Constitutional: Negative for fever, chills, diaphoresis, activity change, appetite change, fatigue and unexpected weight change.  HENT: Negative for congestion, dental problem, drooling, ear discharge, ear pain, facial swelling, hearing loss, mouth sores, nosebleeds, postnasal drip, rhinorrhea, sinus pressure, sneezing, sore throat, tinnitus, trouble swallowing and voice change.   Eyes: Negative for photophobia, pain, discharge, redness, itching and visual disturbance.  Respiratory: Negative for apnea, cough, choking, chest tightness, shortness of breath, wheezing and stridor.   Cardiovascular: Negative for chest pain, palpitations and leg swelling.  Gastrointestinal: Negative for nausea, vomiting, abdominal pain, diarrhea, constipation, blood in stool, abdominal distention, anal bleeding and rectal pain.  Endocrine: Negative for cold intolerance, heat intolerance, polydipsia, polyphagia and polyuria.  Genitourinary: Negative for dysuria, urgency, frequency, hematuria, flank pain, decreased urine volume, vaginal bleeding, vaginal discharge, enuresis, difficulty urinating, genital sores, vaginal pain, menstrual problem, pelvic pain and dyspareunia.  Musculoskeletal: Negative for myalgias, back pain, joint swelling, arthralgias, gait problem, neck pain and neck  stiffness.  Skin: Negative for color change, pallor, rash and wound.  Allergic/Immunologic: Negative for environmental allergies, food allergies and immunocompromised state.  Neurological: Negative for dizziness, tremors, seizures, syncope, facial asymmetry, speech difficulty, weakness, light-headedness, numbness and headaches.  Hematological: Negative for adenopathy. Does not bruise/bleed easily.  Psychiatric/Behavioral: Negative for suicidal ideas, hallucinations, behavioral problems, confusion, sleep disturbance, self-injury, dysphoric mood, decreased concentration and agitation. The patient is not nervous/anxious and is not hyperactive.     History reviewed. No pertinent past medical history. Past Surgical History  Procedure Laterality Date  . Tubal ligation    . Cosmetic surgery      B eye lift   Allergies  Allergen Reactions  . Shellfish Allergy Itching and Rash   No current outpatient prescriptions on file.   No current facility-administered medications for this visit.   Social History   Social History  . Marital Status: Married    Spouse Name: N/A  . Number of Children: N/A  . Years of Education: N/A   Occupational History  . Not on file.   Social History Main Topics  . Smoking status: Never Smoker   . Smokeless tobacco: Not on file  . Alcohol Use: No  . Drug Use: No  . Sexual Activity: Yes    Birth Control/ Protection: None   Other Topics Concern  . Not on file   Social History Narrative   Marital status: married x 32 years; From [place].  [place] since 1987.      Children:  2 children; 3 grandchildren      Lives: with husband, 1 daughter     Employment: Location manager x 2000.     Tobacco: never      Alcohol: none      Exercise: 3 days per week; classes; [employer]      Seatbelt:  Drives; 100%; no texting      Family History  Problem Relation Age of Onset  . Heart disease Sister        Objective:    BP 130/82 mmHg  Pulse 77  Temp(Src) 97.4 F  (36.3 C) (Oral)  Resp 18  Ht 4' 11.5" (1.511 m)  Wt 136 lb (61.689 kg)  BMI 27.02 kg/m2  SpO2 96% Physical Exam  Constitutional: She is oriented to person, place, and time. She appears well-developed and well-nourished. No distress.  HENT:  Head: Normocephalic and atraumatic.  Right Ear: External ear normal.  Left Ear: External ear normal.  Nose: Nose normal.  Mouth/Throat: Oropharynx is clear and moist.  Eyes: Conjunctivae and EOM are normal. Pupils are equal, round, and reactive to light.  Neck: Normal range of motion and full passive range of motion without pain. Neck supple. No JVD present. Carotid bruit is not present. No thyromegaly present.  Cardiovascular: Normal rate, regular rhythm and normal heart sounds.  Exam reveals no gallop and no friction rub.   No murmur heard. Pulmonary/Chest: Effort normal and breath sounds normal. She has no wheezes. She has no rales. Right breast exhibits no inverted nipple, no mass, no nipple discharge, no skin change and no tenderness. Left breast exhibits no inverted nipple, no mass, no nipple discharge, no skin change and no tenderness. Breasts are symmetrical.  Abdominal: Soft. Bowel sounds are normal. She exhibits no distension and no mass. There is no tenderness. There is no rebound and no guarding.  Genitourinary: Vagina normal. There is no rash, tenderness or lesion on the right labia. There is no rash, tenderness or lesion on the left labia. Uterus is enlarged. Cervix exhibits no motion tenderness, no discharge and no friability. Right adnexum displays no mass, no tenderness and no fullness. Left adnexum displays no mass, no tenderness and no fullness.  Musculoskeletal:       Right shoulder: Normal.       Left shoulder: Normal.       Cervical back: Normal.  Lymphadenopathy:    She has no cervical adenopathy.  Neurological: She is alert and oriented to person, place, and time. She has normal reflexes. No cranial nerve deficit. She exhibits  normal muscle tone. Coordination normal.  Skin: Skin is warm and dry. No rash noted. She is not diaphoretic. No erythema. No pallor.  Psychiatric: She has a normal mood and affect. Her behavior is normal. Judgment and thought content normal.  Nursing note and vitals reviewed.  Results for orders placed or performed in visit on 05/20/16  POCT urinalysis dipstick  Result Value Ref Range   Color, UA yellow yellow   Clarity, UA clear clear   Glucose, UA negative negative   Bilirubin, UA negative negative   Ketones, POC UA negative negative   Spec Grav, UA 1.010    Blood, UA small (A) negative   pH, UA 5.5    Protein Ur, POC negative negative   Urobilinogen, UA 0.2    Nitrite, UA Negative Negative   Leukocytes, UA Negative Negative  POCT urine pregnancy  Result Value Ref Range   Preg Test, Ur Negative Negative  POCT Microscopic Urinalysis ([facility])  Result Value Ref Range   WBC,UR,HPF,POC None None WBC/hpf   RBC,UR,HPF,POC Few (A) None RBC/hpf   Bacteria Few (A) None, Too numerous to count   Mucus Present (A) Absent   Epithelial Cells, UR Per Microscopy Few (A) None, Too numerous to count cells/hpf  Assessment & Plan:   1. Routine physical examination   2. Encounter for routine gynecological examination   3. Cervical cancer screening   4. Uterine enlargement   5. Perimenopause   6. Hyperlipidemia   7. Screening for HIV (human immunodeficiency virus)   8. Screening for thyroid disorder   9. Screening for diabetes mellitus    -anticipatory guidance provided --- exercise, weight loss, 3 servings of dairy daily. -pap smear obtained; advised on current guidelines of pap smears every 3-5 years if WNL and HPV negative this year. -discussed mammogram guidelines; pt desires mammography every 2 years. -immunizations UTD. -obtain age appropriate screening labs. -refer for pelvic us due to enlarged uterus on pelvic exam. -perimenopausal:  LMP 09-2015; urine pregnancy negative.   Stable.    Orders Placed This Encounter  Procedures  . US Pelvis Complete    Standing Status: Future     Number of Occurrences:      Standing Expiration Date: 07/20/2017    Order Specific Question:  Reason for Exam (SYMPTOM  OR DIAGNOSIS REQUIRED)    Answer:  uterine enlargement    Order Specific Question:  [facility]    [facility]  . CBC with Differential/Platelet  . Comprehensive metabolic panel    Order Specific Question:  Has the patient fasted?    Answer:  Yes  . Hemoglobin A1c  . HIV antibody  . Lipid panel    Order Specific Question:  Has the patient fasted?    Answer:  Yes  . TSH  . POCT urinalysis dipstick  . POCT urine pregnancy  . POCT Microscopic Urinalysis ([facility])   No orders of the defined types were placed in this encounter.    Return in about 1 year (around 05/20/2017) for complete physical examiniation.    Anayely Constantine Paulita FujitaMartin Larnie Heart, M.D. [facility]  [place] [address] [place], [place]  [id number] [phone] phone [phone] fax

## 2016-05-20 NOTE — Patient Instructions (Addendum)
1. You will be contacted to schedule a pelvic ultrasound in the upcoming 1-2 weeks; this will visualize your ovaries and uterus (because your uterus feels slightly enlarged).     IF you received an x-ray today, you will receive an invoice from [facility]. Please contact [facility] at [phone] with questions or concerns regarding your invoice.   IF you received labwork today, you will receive an invoice from [employer]. Please contact [facility] at [phone] with questions or concerns regarding your invoice.   Our billing staff will not be able to assist you with questions regarding bills from these companies.  You will be contacted with the lab results as soon as they are available. The fastest way to get your results is to activate your My Chart account. Instructions are located on the last page of this paperwork. If you have not heard from [place] regarding the results in 2 weeks, please contact this office.     Keeping You Healthy  Get These Tests 1. Blood Pressure- Have your blood pressure checked once a year by your health care provider.  Normal blood pressure is 120/80. 2. Weight- Have your body mass index (BMI) calculated to screen for obesity.  BMI is measure of body fat based on height and weight.  You can also calculate your own BMI at [email or web address]-esparza.[email or web address] 3. Cholesterol- Have your cholesterol checked every 5 years starting at age 27 then yearly starting at age 68. 4. Chlamydia, HIV, and other sexually transmitted diseases- Get screened every year until age 66, then within three months of each new sexual provider. 5. Pap Test - Every 1-5 years; discuss with your health care provider. 6. Mammogram- Every 1-2 years starting at age 67--50  Take these medicines  Calcium with Vitamin D-Your body needs 1200 mg of Calcium each day and [phone] IU of Vitamin D daily.  Your body can only absorb 500 mg of Calcium at a time so Calcium must  be taken in 2 or 3 divided doses throughout the day.  Multivitamin with folic acid- Once daily if it is possible for you to become pregnant.  Get these Immunizations  Gardasil-Series of three doses; prevents HPV related illness such as genital warts and cervical cancer.  Menactra-Single dose; prevents meningitis.  Tetanus shot- Every 10 years.  Flu shot-Every year.  Take these steps 1. Do not smoke-Your healthcare provider can help you quit.  For tips on how to quit go to www.smokefree.gov or call 1-800 QUITNOW. 2. Be physically active- Exercise 5 days a week for at least 30 minutes.  If you are not already physically active, start slow and gradually work up to 30 minutes of moderate physical activity.  Examples of moderate activity include walking briskly, dancing, swimming, bicycling, etc. 3. Breast Cancer- A self breast exam every month is important for early detection of breast cancer.  For more information and instruction on self breast exams, ask your healthcare provider or [email or web address]. 4. Eat a healthy diet- Eat a variety of healthy foods such as fruits, vegetables, whole grains, low fat milk, low fat cheeses, yogurt, lean meats, poultry and fish, beans, nuts, tofu, etc.  For more information go to www. Thenutritionsource.org 5. Drink alcohol in moderation- Limit alcohol intake to one drink or less per day. Never drink and drive. 6. Depression- Your emotional health is as important as your physical health.  If you're feeling down or losing interest in things you normally enjoy please talk to your healthcare provider about being  screened for depression. 7. Dental visit- Brush and floss your teeth twice daily; visit your dentist twice a year. 8. Eye doctor- Get an eye exam at least every 2 years. 9. Helmet use- Always wear a helmet when riding a bicycle, motorcycle, rollerblading or skateboarding. 10. Safe sex- If you may be exposed to sexually  transmitted infections, use a condom. 11. Seat belts- Seat belts can save your live; always wear one. 12. Smoke/Carbon Monoxide detectors- These detectors need to be installed on the appropriate level of your home. Replace batteries at least once a year. 13. Skin cancer- When out in the sun please cover up and use sunscreen 15 SPF or higher. 14. Violence- If anyone is threatening or hurting you, please tell your healthcare provider.

## 2016-05-21 LAB — HEMOGLOBIN A1C
Hgb A1c MFr Bld: 5.9 % — ABNORMAL HIGH (ref <5.7)
MEAN PLASMA GLUCOSE: 123 mg/dL

## 2016-05-21 LAB — HIV ANTIBODY (ROUTINE TESTING W REFLEX): HIV 1&2 Ab, 4th Generation: NONREACTIVE

## 2016-05-23 LAB — PAP IG W/ RFLX HPV ASCU

## 2016-05-24 LAB — HUMAN PAPILLOMAVIRUS, HIGH RISK: HPV DNA HIGH RISK: NOT DETECTED

## 2016-06-09 ENCOUNTER — Other Ambulatory Visit: Payer: Self-pay | Admitting: Family Medicine

## 2016-06-09 DIAGNOSIS — N852 Hypertrophy of uterus: Secondary | ICD-10-CM

## 2016-06-11 ENCOUNTER — Encounter: Payer: Self-pay | Admitting: Family Medicine

## 2016-06-14 ENCOUNTER — Encounter: Payer: Self-pay | Admitting: *Deleted

## 2016-06-28 ENCOUNTER — Other Ambulatory Visit: Payer: BLUE CROSS/BLUE SHIELD

## 2016-06-30 ENCOUNTER — Ambulatory Visit
Admission: RE | Admit: 2016-06-30 | Discharge: 2016-06-30 | Disposition: A | Discharge status: Home / Self Care | Payer: BLUE CROSS/BLUE SHIELD | Source: Ambulatory Visit | Attending: Family Medicine | Admitting: Family Medicine

## 2016-06-30 DIAGNOSIS — N852 Hypertrophy of uterus: Secondary | ICD-10-CM

## 2016-08-11 IMAGING — US US TRANSVAGINAL NON-OB
1 series · 14 of 25 positions shown · non-contrast
Comparison: None

CLINICAL DATA: Uterine enlargement on physical exam



[Series 1: us transvaginal non-ob · 0.32mm/px · 14 of 57 slices shown]
[im 1/57]
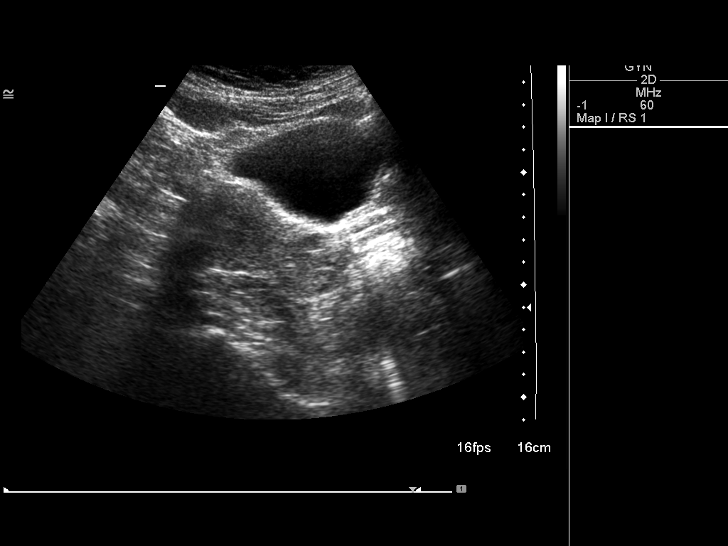
[im 5/57]
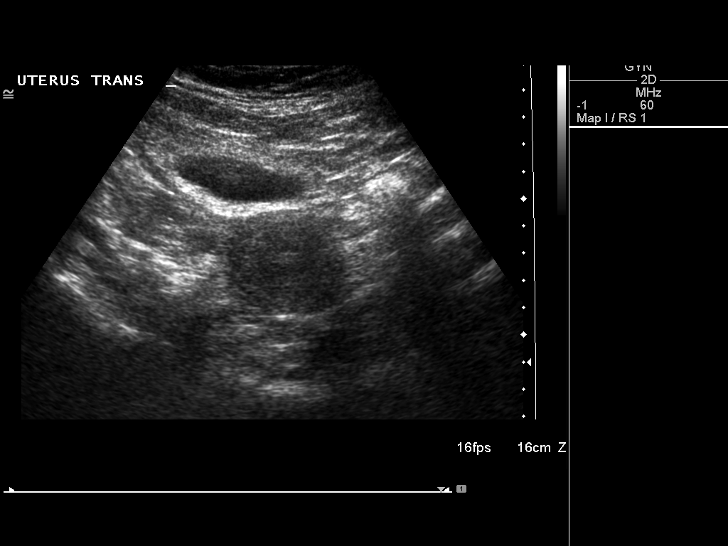
[im 10/57]
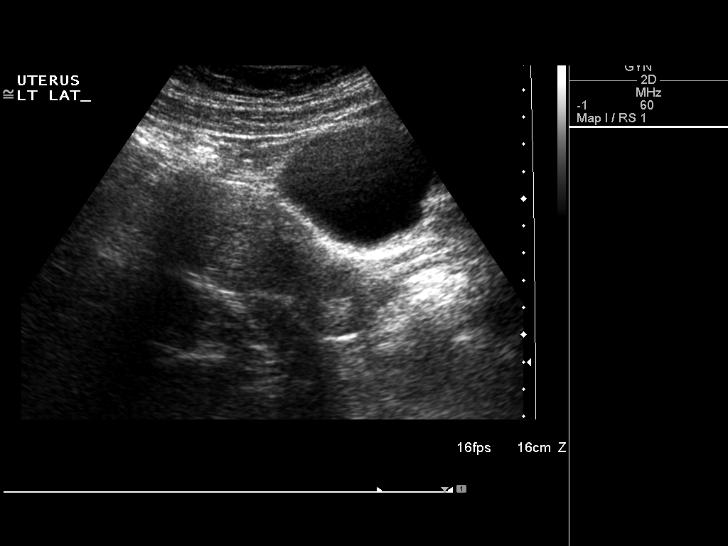
[im 15/57]
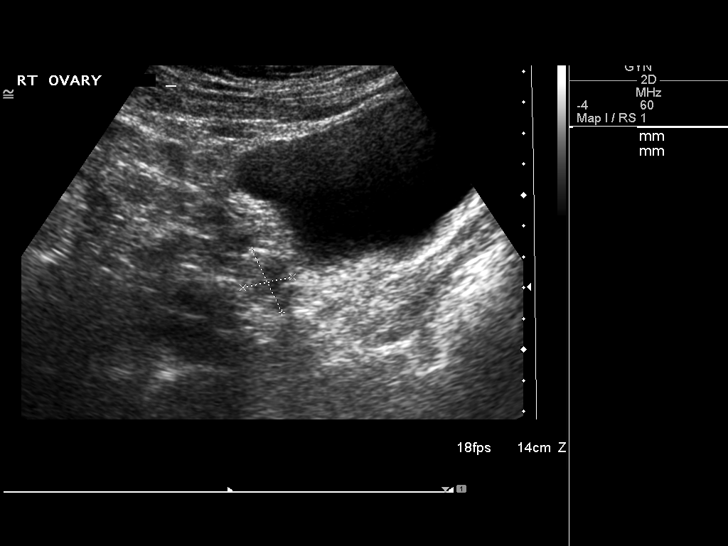
[im 19/57]
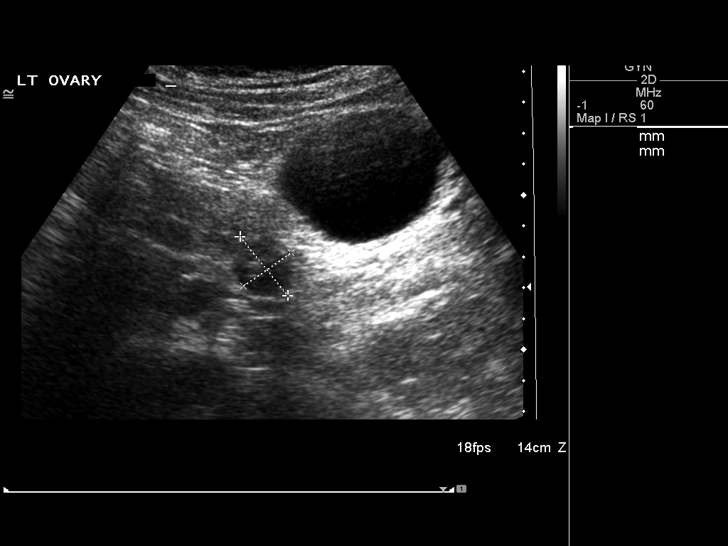
[im 22/57]
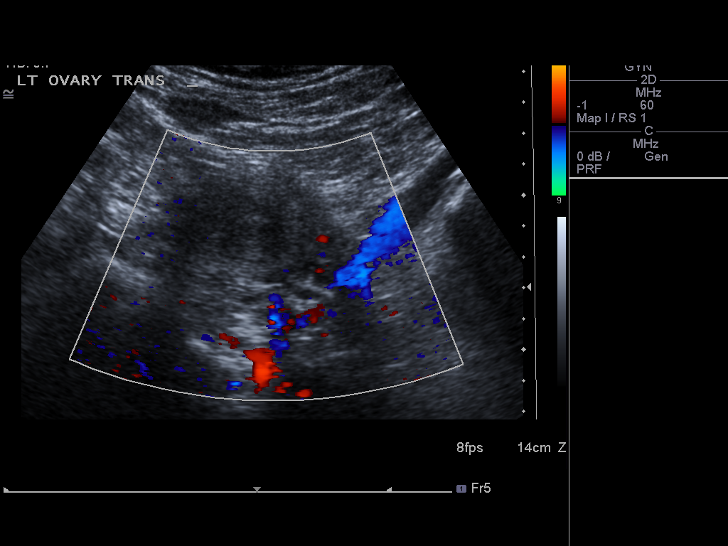
[im 26/57]
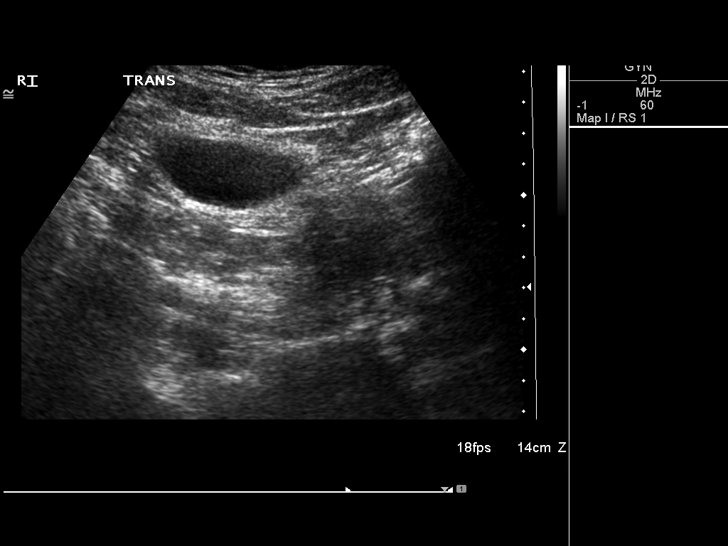
[im 31/57]
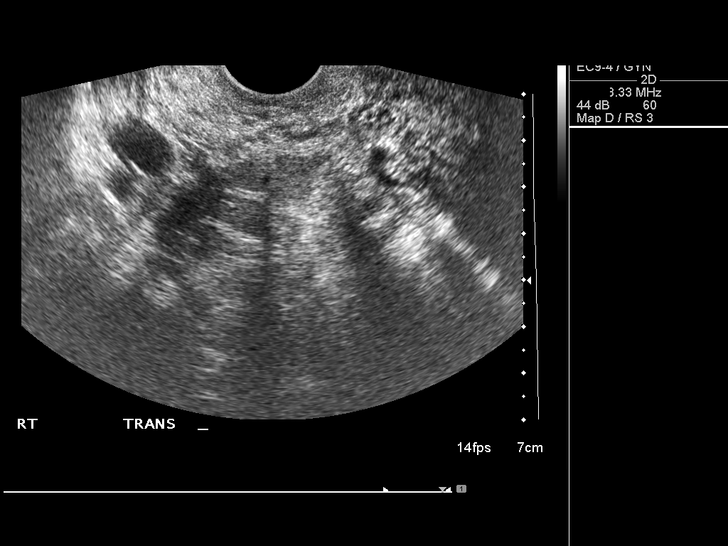
[im 36/57]
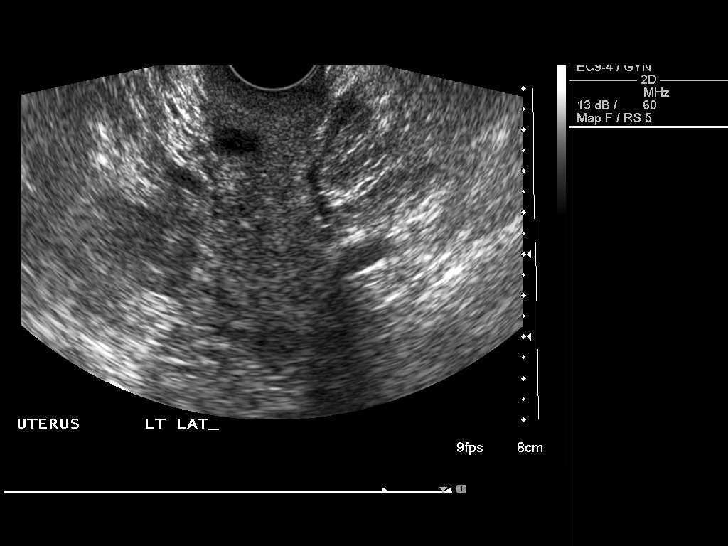
[im 38/57]
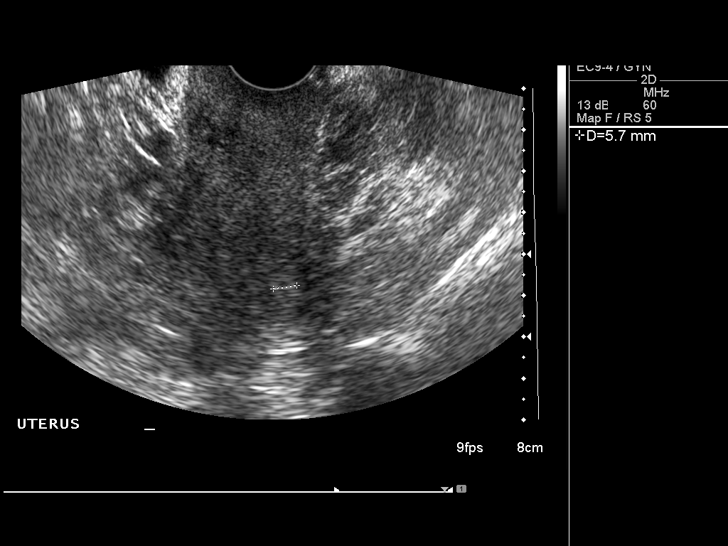
[im 43/57]
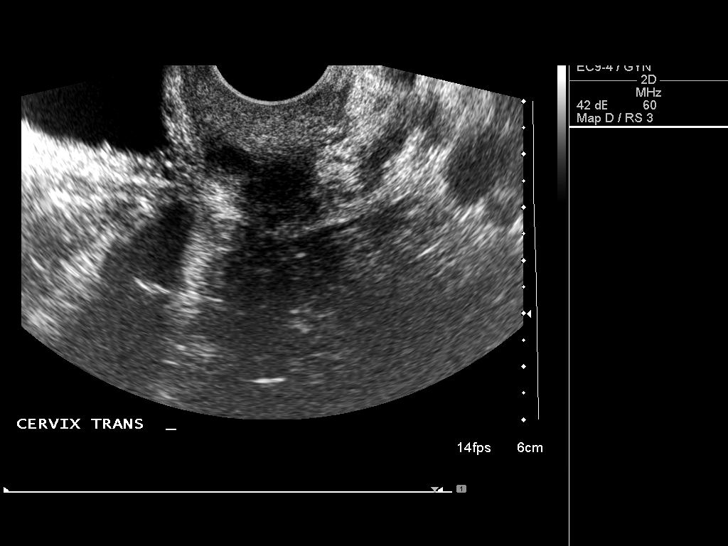
[im 47/57]
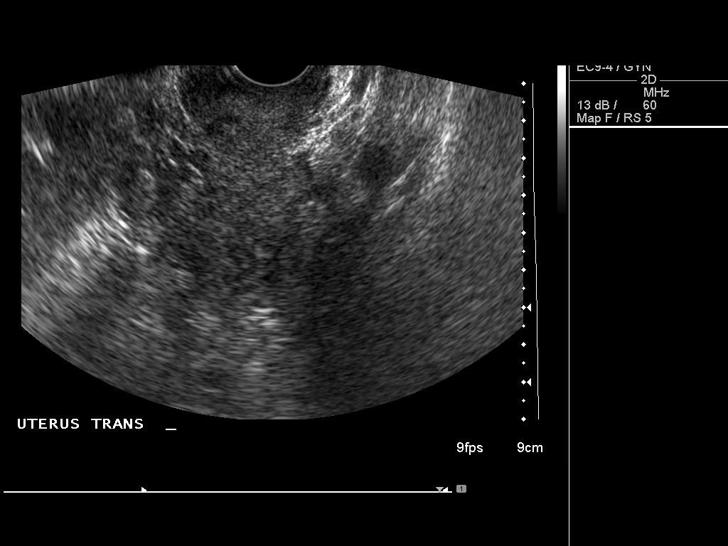
[im 52/57]
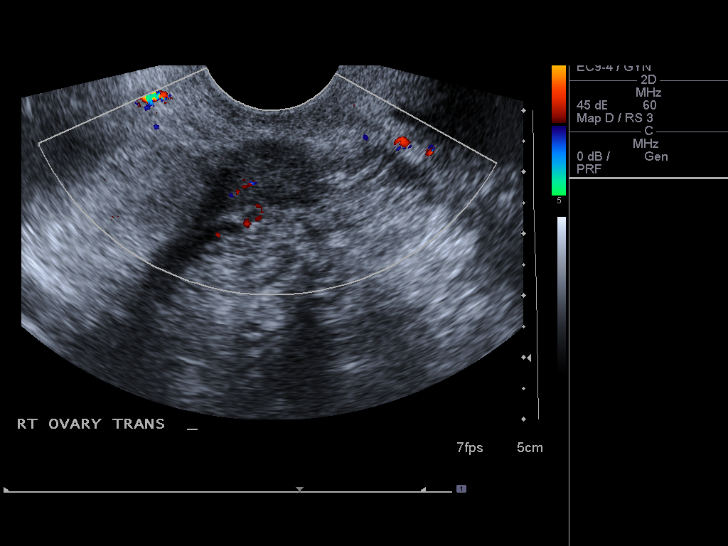
[im 57/57]
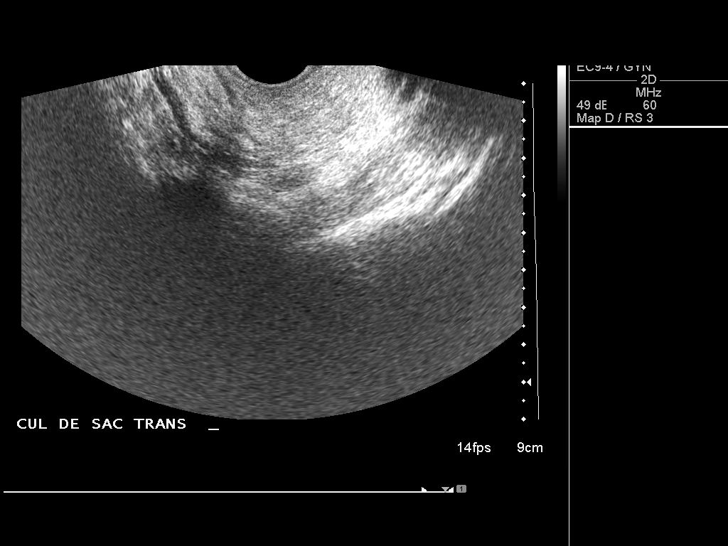

[14 of 25 positions shown; findings below may reference images not displayed]

FINDINGS: Uterus

Measurements: 8.1 x 4.4 x 4.6 cm. No fibroids or other mass
visualized.

Endometrium

Thickness: 6 mm.  No focal abnormality visualized.

Right ovary

Measurements: 2.6 x 1.2 x 2.0 cm. Normal appearance/no adnexal mass.

Left ovary

Measurements: 2.5 x 1.9 x 2.17. Normal appearance/no adnexal mass.

Other findings

No abnormal free fluid.
IMPRESSION: Negative pelvic ultrasound.

## 2018-05-16 ENCOUNTER — Encounter: Payer: Self-pay | Admitting: Family Medicine
